# Patient Record
Sex: Female | Born: 1995 | Race: White | Hispanic: No | Marital: Single | Smoking: Never smoker
Health system: Southern US, Community
[De-identification: ages and names within clinical notes are randomized; demographics above are authoritative.]

---
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
## 2015-07-10 ENCOUNTER — Telehealth: Payer: Self-pay | Admitting: Cardiology

## 2015-07-10 NOTE — Telephone Encounter (Signed)
Records rec via fax from [facility], 07/18/15 appointment with Dr.Skains placed in chart prep bin.

## 2015-07-15 ENCOUNTER — Encounter: Payer: Self-pay | Admitting: Cardiology

## 2015-07-18 ENCOUNTER — Ambulatory Visit (INDEPENDENT_AMBULATORY_CARE_PROVIDER_SITE_OTHER): Payer: BC Managed Care – PPO | Admitting: Cardiology

## 2015-07-18 ENCOUNTER — Encounter: Payer: Self-pay | Admitting: Cardiology

## 2015-07-18 VITALS — BP 124/70 | HR 82 | Ht 65.0 in | Wt 187.4 lb

## 2015-07-18 DIAGNOSIS — R079 Chest pain, unspecified: Secondary | ICD-10-CM | POA: Diagnosis not present

## 2015-07-18 NOTE — Patient Instructions (Signed)
Medication Instructions:  The current medical regimen is effective;  continue present plan and medications.  Testing/Procedures: Your physician has requested that you have an echocardiogram. Echocardiography is a painless test that uses sound waves to create images of your heart. It provides your doctor with information about the size and shape of your heart and how well your heart's chambers and valves are working. This procedure takes approximately one hour. There are no restrictions for this procedure.  Follow-Up: Follow up as needed after testing.  If you need a refill on your cardiac medications before your next appointment, please call your pharmacy.  Thank you for choosing [facility]!!     

## 2015-07-18 NOTE — Progress Notes (Signed)
Cardiology Office Note   Date:  07/18/2015   ID:  Deanna FavorsElizabeth Graves, DOB 1996/04/06, MRN [id number]  PCP:  Norman ClayLOWE,MELISSA V, MD  Cardiologist:   Donato SchultzSKAINS, MARK, MD   No chief complaint on file.     History of Present Illness: Deanna Favorslizabeth Medico is a 19 y.o. female who presents for evaluation of chest pain and left arm pain. She is currently a [employer]. Occasionally during sporadic times she will feel chest discomfort, centralized sometimes associated with left arm pain or numbness. Occasionally anxiety or stress can bring this on. She states that she usually does not wake up with the discomfort but throughout the day she may start to feel the ache in her chest and sometimes it can last for several hours duration. She has not had any syncopal episodes, no significant shortness of breath or diaphoresis. She has been able to exercise without difficulty. Her mother has a history of mitral valve prolapse. No early family history of coronary artery disease or sudden cardiac death. She has not had any unexplained syncopal episode during exercise.  During one of these episodes she went to the student health clinic and had an EKG. She remembers the doctor there telling her that she did have a short PR interval in passing.  Food does not seem to be a trigger. Sometimes she may feel a rare skip in her heartbeat but this is very infrequent. One time her hand felt cold and numb.  She has taken a sociology class. She is considering psychology major.  No smoking, rare social alcohol.    No past medical history on file.  No past surgical history on file.   Current Outpatient Prescriptions  Medication Sig Dispense Refill  . MONONESSA 0.25-35 MG-MCG tablet Take 1 tablet by mouth daily.  12   No current facility-administered medications for this visit.    Allergies:   Review of patient's allergies indicates no known allergies.    Social History:  The patient  reports that she  has never smoked. She does not have any smokeless tobacco history on file. She reports that she does not drink alcohol or use illicit drugs.   Family History:  The patient's family history as above. No early family history of coronary artery disease.    ROS:  Please see the history of present illness.   Otherwise, review of systems are positive for chest pain, back pain, fatigue, irregular heartbeats, anxiety, headaches, left arm hurt.   All other systems are reviewed and negative.    PHYSICAL EXAM: VS:  BP 124/70 mmHg  Pulse 82  Ht 5\' 5"  (1.651 m)  Wt 187 lb 6.4 oz (85.004 kg)  BMI 31.18 kg/m2  LMP 07/16/2015 , BMI Body mass index is 31.18 kg/(m^2). GEN: Well nourished, well developed, in no acute distress HEENT: normal Neck: no JVD, carotid bruits, or masses Cardiac: RRR; no murmurs, rubs, or gallops,no edema  Respiratory:  clear to auscultation bilaterally, normal work of breathing GI: soft, nontender, nondistended, + BS MS: no deformity or atrophy Skin: warm and dry, no rash Neuro:  Strength and sensation are intact Psych: euthymic mood, full affect   EKG:  Today 07/18/15-sinus rhythm, 81, no other abnormalities, normal EKG, personally viewed.   Recent Labs: No results found for requested labs within last 365 days.    Lipid Panel No results found for: CHOL, TRIG, HDL, CHOLHDL, VLDL, LDLCALC, LDLDIRECT    Wt Readings from Last 3 Encounters:  07/18/15 187  lb 6.4 oz (85.004 kg) (96 %*, Z = 1.76)   * Growth percentiles are based on CDC 2-20 Years data.      Other studies Reviewed: Additional studies/ records that were reviewed today include: Medical records reviewed, EKG reviewed. Review of the above records demonstrates: As above   ASSESSMENT AND PLAN:  1.  Atypical chest pain  - EKG is reassuring. No evidence of WPW syndrome, ischemia. QT interval is normal.  - I will check an echocardiogram to ensure proper structure and function of her heart. I did not  appreciate any murmurs.  - No fevers, no indications of pericarditis.  - Possible etiologies for her discomfort include musculoskeletal, which can sometimes be exacerbated by anxiety and muscular tension.  - GERD may play a role and she may wish to try Pepcid to see if this helps.  - Reassuringly, she is able to exercise without any difficulty.  - If symptoms worsen or become a worrisome, she knows to contact me.    Current medicines are reviewed at length with the patient today.  The patient does not have concerns regarding medicines.  The following changes have been made:  no change  Labs/ tests ordered today include:   Orders Placed This Encounter  Procedures  . EKG 12-Lead  . Echocardiogram     Disposition:   We will follow-up with results of testing  Signed, Donato Schultz, MD  07/18/2015 4:31 PM    [facility] [address] [place], [place], [place]  [address] Phone: [phone]; Fax: [phone]

## 2015-07-30 ENCOUNTER — Ambulatory Visit (HOSPITAL_COMMUNITY): Payer: BC Managed Care – PPO | Attending: Cardiovascular Disease

## 2015-07-30 ENCOUNTER — Other Ambulatory Visit: Payer: Self-pay

## 2015-07-30 DIAGNOSIS — R079 Chest pain, unspecified: Secondary | ICD-10-CM | POA: Diagnosis not present

## 2015-08-01 ENCOUNTER — Telehealth: Payer: Self-pay | Admitting: Cardiology

## 2015-08-01 NOTE — Telephone Encounter (Signed)
New message  ° ° °Patient calling for test results.   °

## 2015-08-01 NOTE — Telephone Encounter (Signed)
Called patient and advised of normal ECHO.  Patient expressed understanding.

## 2015-08-28 ENCOUNTER — Encounter: Payer: Self-pay | Admitting: *Deleted

## 2016-02-03 ENCOUNTER — Other Ambulatory Visit: Payer: Self-pay | Admitting: Specialist

## 2016-02-03 DIAGNOSIS — M5416 Radiculopathy, lumbar region: Secondary | ICD-10-CM

## 2016-02-08 ENCOUNTER — Ambulatory Visit
Admission: RE | Admit: 2016-02-08 | Discharge: 2016-02-08 | Disposition: A | Discharge status: Home / Self Care | Payer: BC Managed Care – PPO | Source: Ambulatory Visit | Attending: Specialist | Admitting: Specialist

## 2016-02-08 DIAGNOSIS — M5416 Radiculopathy, lumbar region: Secondary | ICD-10-CM

## 2018-04-04 ENCOUNTER — Other Ambulatory Visit: Payer: Self-pay | Admitting: Gastroenterology

## 2018-04-04 DIAGNOSIS — K589 Irritable bowel syndrome without diarrhea: Secondary | ICD-10-CM

## 2018-04-08 ENCOUNTER — Encounter: Payer: Self-pay | Admitting: Radiology

## 2018-04-08 ENCOUNTER — Ambulatory Visit
Admission: RE | Admit: 2018-04-08 | Discharge: 2018-04-08 | Disposition: A | Discharge status: Home / Self Care | Payer: BC Managed Care – PPO | Source: Ambulatory Visit | Attending: Gastroenterology | Admitting: Gastroenterology

## 2018-04-08 DIAGNOSIS — K589 Irritable bowel syndrome without diarrhea: Secondary | ICD-10-CM

## 2019-01-27 IMAGING — DX DG UGI W/ SMALL BOWEL HIGH DENSITY
1 series · 1 of 1 positions shown · non-contrast
Comparison: Lumbar MRI 02/08/2016.

CLINICAL DATA: 21-year-old female with irritable bowel syndrome.

EXAM:
UPPER GI SERIES WITH SMALL BOWEL FOLLOW-THROUGH
TECHNIQUE: After obtaining a scout radiograph a combined double contrast and
single contrast upper GI series using effervescent crystals, thick
barium, and thin barium. Subsequently, serial images of the small
bowel were obtained including spot views of the terminal ileum.
FLUOROSCOPY TIME:  Number of Acquired Spot Images: 0
Fluoroscopy Time:  1 minutes 42 seconds
Radiation Exposure Index (if provided by the fluoroscopic device):
250 mGy

[dg abd 1 view]
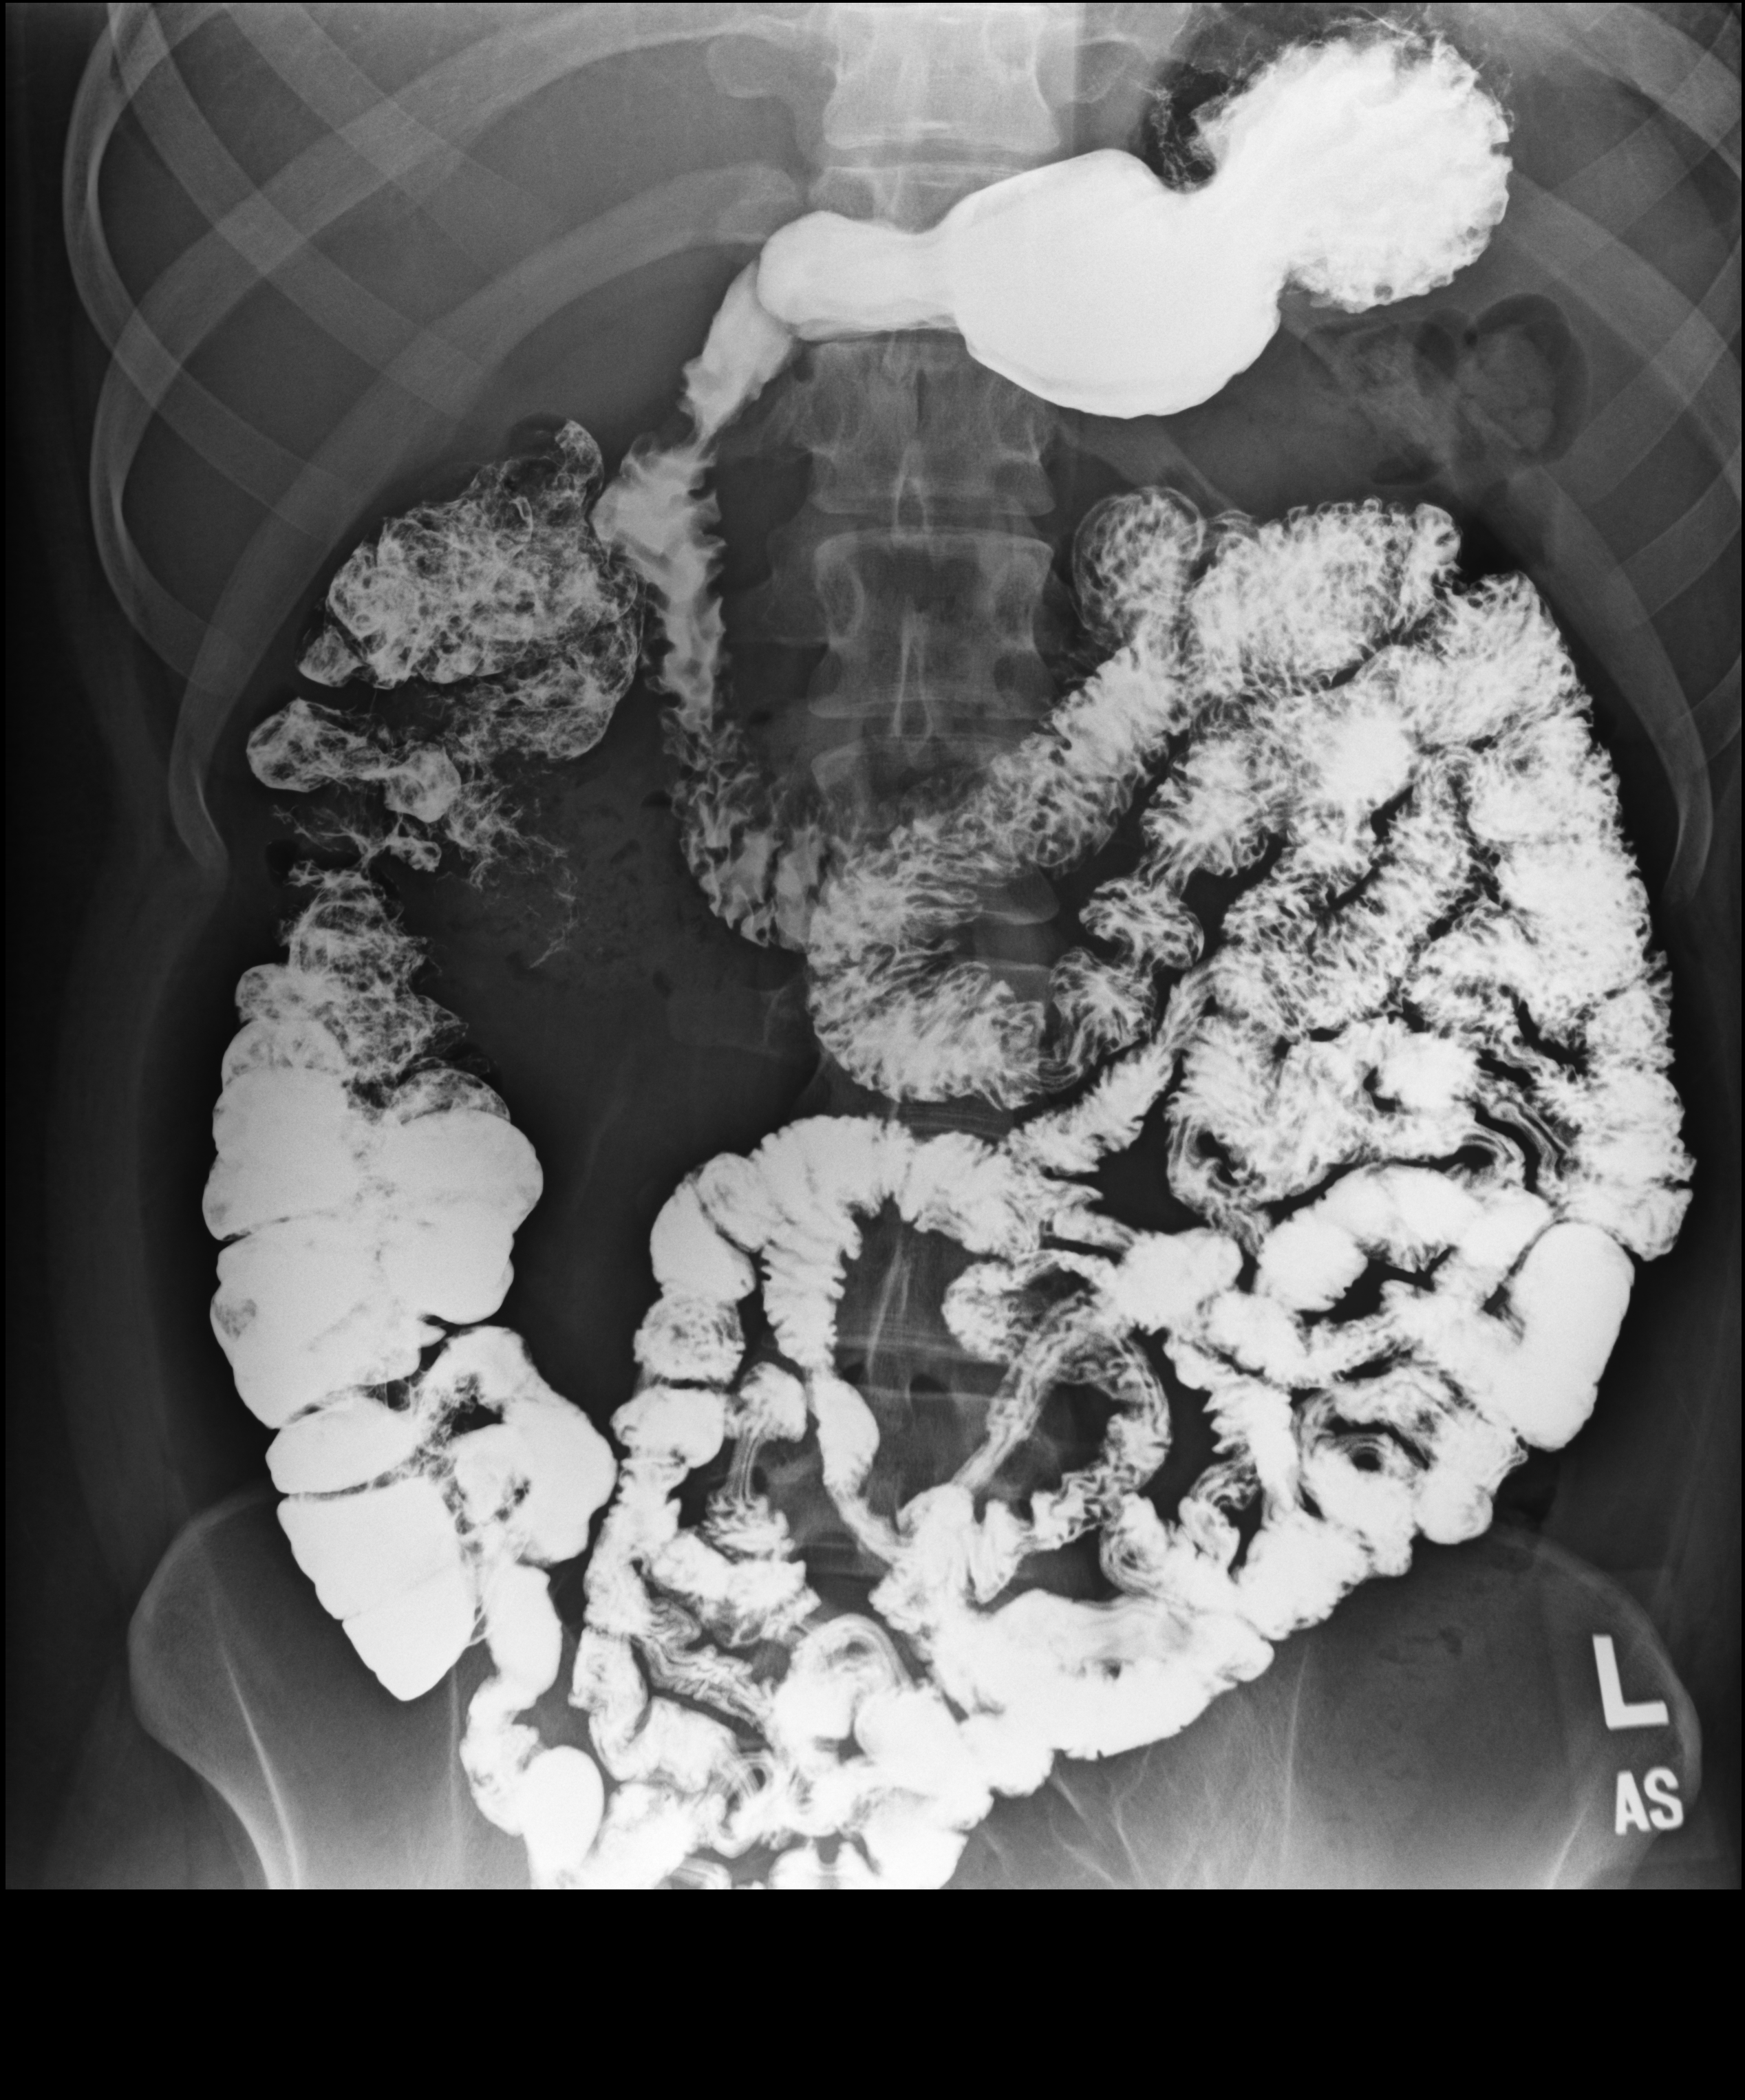

[1 of 1 positions shown; findings below may reference images not displayed]

FINDINGS: Preprocedural scout view of the abdomen. Normal visible bowel gas
pattern and visceral contours. No osseous abnormality identified.

A double contrast study was undertaken and the patient tolerated
this well and without difficulty.

No obstruction to the forward flow of contrast throughout the
esophagus and into the stomach. Normal esophageal course and
contour. Normal esophageal mucosal pattern.

Good gastric coating with barium. Prompt gastric emptying. Normal
gastric contour and mucosal pattern. Normal duodenum bulb and
C-loop. Normal gastroesophageal junction.

With prone swallows esophageal motility is within normal limits. No
gastroesophageal reflux occurred.

Attention was then turned to to the small bowel follow-through. At
the conclusion of the upper GI portion multiple proximal jejunum
loops were opacified.

After an additional 25 minutes delay oral contrast had reached the
ascending colon. The terminal ileum is well demonstrated on that
image and appears normal. Jejunum and ileum mucosal pattern appear
normal. Additional fluoroscopic evaluation of the small bowel was
unremarkable.
IMPRESSION: Normal upper GI series and small-bowel follow-through.
